# Patient Record
Sex: Male | Born: 1985 | Race: White | Hispanic: No | Marital: Single | Smoking: Current every day smoker
Health system: Southern US, Community
[De-identification: ages and names within clinical notes are randomized; demographics above are authoritative.]

## PROBLEM LIST (undated history)

## (undated) DIAGNOSIS — F988 Other specified behavioral and emotional disorders with onset usually occurring in childhood and adolescence: Secondary | ICD-10-CM

## (undated) DIAGNOSIS — F259 Schizoaffective disorder, unspecified: Secondary | ICD-10-CM

## (undated) HISTORY — PX: APPENDECTOMY: SHX54

---

## 2012-09-14 ENCOUNTER — Emergency Department (HOSPITAL_COMMUNITY)
Admission: EM | Admit: 2012-09-14 | Discharge: 2012-09-15 | Disposition: A | Discharge status: Home / Self Care | Payer: Self-pay | Attending: Emergency Medicine | Admitting: Emergency Medicine

## 2012-09-14 ENCOUNTER — Encounter (HOSPITAL_COMMUNITY): Payer: Self-pay | Admitting: Nurse Practitioner

## 2012-09-14 DIAGNOSIS — F1110 Opioid abuse, uncomplicated: Secondary | ICD-10-CM | POA: Insufficient documentation

## 2012-09-14 DIAGNOSIS — F17200 Nicotine dependence, unspecified, uncomplicated: Secondary | ICD-10-CM | POA: Insufficient documentation

## 2012-09-14 DIAGNOSIS — Z79899 Other long term (current) drug therapy: Secondary | ICD-10-CM | POA: Insufficient documentation

## 2012-09-14 DIAGNOSIS — R21 Rash and other nonspecific skin eruption: Secondary | ICD-10-CM | POA: Insufficient documentation

## 2012-09-14 LAB — ACETAMINOPHEN LEVEL: Acetaminophen (Tylenol), Serum: <15 ug/mL (ref 10–30)

## 2012-09-14 LAB — RAPID URINE DRUG SCREEN, HOSP PERFORMED: Barbiturates: NOT DETECTED

## 2012-09-14 LAB — COMPREHENSIVE METABOLIC PANEL
Alkaline Phosphatase: 78 U/L (ref 39–117)
BUN: 10 mg/dL (ref 6–23)
CO2: 30 mEq/L (ref 19–32)
Chloride: 96 mEq/L (ref 96–112)
GFR calc Af Amer: >90 mL/min (ref >90)
GFR calc non Af Amer: >90 mL/min (ref >90)
Glucose, Bld: 92 mg/dL (ref 70–99)
Potassium: 3.8 mEq/L (ref 3.5–5.1)
Total Bilirubin: 0.3 mg/dL (ref 0.3–1.2)

## 2012-09-14 LAB — CBC
HCT: 42.4 % (ref 39.0–52.0)
Hemoglobin: 14.6 g/dL (ref 13.0–17.0)
MCHC: 34.4 g/dL (ref 30.0–36.0)

## 2012-09-14 LAB — ETHANOL: Alcohol, Ethyl (B): <11 mg/dL (ref 0–11)

## 2012-09-14 MED ORDER — CLONIDINE HCL 0.1 MG PO TABS: 0.1000 mg | ORAL_TABLET | Freq: Every day | ORAL | Status: DC

## 2012-09-14 MED ORDER — DICYCLOMINE HCL 20 MG PO TABS
20.0000 mg | ORAL_TABLET | Freq: Four times a day (QID) | ORAL | Status: DC | PRN
  Filled 2012-09-14: qty 1

## 2012-09-14 MED ORDER — HYDROCORTISONE 1 % EX CREA
TOPICAL_CREAM | Freq: Two times a day (BID) | CUTANEOUS | Status: DC
  Administered 2012-09-14: 19:00:00 via TOPICAL
  Filled 2012-09-14 (×2): qty 28

## 2012-09-14 MED ORDER — HYDROXYZINE HCL 25 MG PO TABS: 25.0000 mg | ORAL_TABLET | Freq: Four times a day (QID) | ORAL | Status: DC | PRN

## 2012-09-14 MED ORDER — LOPERAMIDE HCL 2 MG PO CAPS: 2.0000 mg | ORAL_CAPSULE | ORAL | Status: DC | PRN

## 2012-09-14 MED ORDER — CLONIDINE HCL 0.1 MG PO TABS
0.1000 mg | ORAL_TABLET | Freq: Four times a day (QID) | ORAL | Status: DC
  Administered 2012-09-14 (×3): 0.1 mg via ORAL
  Filled 2012-09-14 (×3): qty 1

## 2012-09-14 MED ORDER — ARIPIPRAZOLE 15 MG PO TABS
15.0000 mg | ORAL_TABLET | Freq: Every day | ORAL | Status: DC
  Administered 2012-09-14: 15 mg via ORAL
  Filled 2012-09-14 (×2): qty 1

## 2012-09-14 MED ORDER — ONDANSETRON 4 MG PO TBDP: 4.0000 mg | ORAL_TABLET | Freq: Four times a day (QID) | ORAL | Status: DC | PRN

## 2012-09-14 MED ORDER — ZOLPIDEM TARTRATE 5 MG PO TABS: 5.0000 mg | ORAL_TABLET | Freq: Every evening | ORAL | Status: DC | PRN

## 2012-09-14 MED ORDER — NAPROXEN 250 MG PO TABS: 500.0000 mg | ORAL_TABLET | Freq: Two times a day (BID) | ORAL | Status: DC | PRN

## 2012-09-14 MED ORDER — METHOCARBAMOL 500 MG PO TABS: 500.0000 mg | ORAL_TABLET | Freq: Three times a day (TID) | ORAL | Status: DC | PRN

## 2012-09-14 MED ORDER — CLONIDINE HCL 0.1 MG PO TABS: 0.1000 mg | ORAL_TABLET | ORAL | Status: DC

## 2012-09-14 MED ORDER — TRAZODONE HCL 50 MG PO TABS
100.0000 mg | ORAL_TABLET | Freq: Every day | ORAL | Status: DC
  Administered 2012-09-14: 100 mg via ORAL
  Filled 2012-09-14: qty 2

## 2012-09-14 NOTE — ED Provider Notes (Signed)
History     CSN: [id number]  Arrival date & time 09/14/12  1436   First MD Initiated Contact with Patient 09/14/12 1451      Chief Complaint  Patient presents with  . Medical Clearance    (Consider location/radiation/quality/duration/timing/severity/associated sxs/prior treatment) The history is provided by the patient.   patient is requesting detox off of heroin. He's also in a methadone clinic using 45 mg a day. He states he continues to use heroin. He states he wants all the opiates now. He recently moved from [place] and is producing less heroin since he came here at the beginning of a new year. He was using just over bundle every other day. He also would use other opiates, however none with Tylenol in them. He does have a diagnosis of schizoaffective disorder. He states he's been off his Abilify and trazodone for a while. No suicidal homicidal thoughts. He does smoke. He injects mostly into his left antecubital area. No abscess. As far as he knows he does not have hepatitis or other blood-borne diseases.  History reviewed. No pertinent past medical history.  Past Surgical History  Procedure Date  . Appendectomy     History reviewed. No pertinent family history.  History  Substance Use Topics  . Smoking status: Current Every Day Smoker  . Smokeless tobacco: Not on file  . Alcohol Use: No      Review of Systems  Constitutional: Negative for activity change and appetite change.  HENT: Negative for neck stiffness.   Eyes: Negative for pain.  Respiratory: Negative for chest tightness and shortness of breath.   Cardiovascular: Negative for chest pain and leg swelling.  Gastrointestinal: Negative for nausea, vomiting, abdominal pain and diarrhea.  Genitourinary: Negative for flank pain.  Musculoskeletal: Negative for back pain.  Skin: Positive for rash.  Neurological: Negative for weakness, numbness and headaches.  Psychiatric/Behavioral: Negative for behavioral problems.     Allergies  Shellfish allergy  Home Medications   Current Outpatient Rx  Name  Route  Sig  Dispense  Refill  . ARIPIPRAZOLE 15 MG PO TABS   Oral   Take 15 mg by mouth at bedtime.         . TRAZODONE HCL 100 MG PO TABS   Oral   Take 100 mg by mouth at bedtime.           BP 99/50  Pulse 66  Temp 97.9 F (36.6 C) (Oral)  Resp 14  Ht 5\' 11"  (1.803 m)  Wt 145 lb (65.772 kg)  BMI 20.22 kg/m2  SpO2 99%  Physical Exam  Nursing note and vitals reviewed. Constitutional: He is oriented to person, place, and time. He appears well-developed and well-nourished.  HENT:  Head: Normocephalic and atraumatic.  Eyes: EOM are normal. Pupils are equal, round, and reactive to light.  Neck: Normal range of motion. Neck supple.  Cardiovascular: Normal rate, regular rhythm and normal heart sounds.   No murmur heard. Pulmonary/Chest: Effort normal and breath sounds normal.  Abdominal: Soft. Bowel sounds are normal. He exhibits no distension and no mass. There is no tenderness. There is no rebound and no guarding.  Musculoskeletal: Normal range of motion. He exhibits no edema.  Neurological: He is alert and oriented to person, place, and time. No cranial nerve deficit.  Skin: Skin is warm and dry.       Injection site a left antecubital area. No abscess. Erythematous rash to right lower extremity near knee. Not raised.  Psychiatric: He  has a normal mood and affect.    ED Course  Procedures (including critical care time)  Labs Reviewed  COMPREHENSIVE METABOLIC PANEL - Abnormal; Notable for the following:    Sodium 134 (*)     All other components within normal limits  SALICYLATE LEVEL - Abnormal; Notable for the following:    Salicylate Lvl <2.0 (*)     All other components within normal limits  URINE RAPID DRUG SCREEN (HOSP PERFORMED) - Abnormal; Notable for the following:    Opiates POSITIVE (*)     All other components within normal limits  ACETAMINOPHEN LEVEL  CBC  ETHANOL    No results found.   1. Heroin abuse       MDM  Patient requesting detox off of heroin and methadone. Off of his psych medications, but states she's not having any symptoms of that. Likely medically cleared, however labs are pending. Patient will be seen by the ACT team.        Juliet Rude. Rubin Payor, MD 09/14/12 [phone]
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
## 2012-09-14 NOTE — ED Notes (Signed)
Pt moved to Pod C and settled into room; warm blankets given; Drinda Butts, RN aware pt is in room

## 2012-09-14 NOTE — BHH Counselor (Signed)
Spoke with [facility] Delice Bison) pt will be reviewed for admission;  Pt states that he does not live in [place] but was living at a homeless shelter in [place]; Pt states he would like to be moved to [facility] after completing Detox

## 2012-09-14 NOTE — ED Notes (Signed)
ACT Team at bedside. Pt calm and cooperative.

## 2012-09-14 NOTE — ED Notes (Signed)
MD at bedside. 

## 2012-09-14 NOTE — ED Notes (Signed)
Pt reports [facility] [place] [facility] and has also been abusing heroine at the same time. Did receive methadone this am. Pt states he wants to stop using heroine and also wants to get off the methadone. Pt states he is "just tired of using substances to feel normal." pt denies thoughts of harming self or others. Reports occasional marijuana use.

## 2012-09-15 NOTE — ED Notes (Signed)
Called parents- states wants to leave, "Im voluntary, so I [facility] to [facility]-- and my mom will pick me up there. I am not going to get high-- I only have $4. " pacing around room, yawning,

## 2012-09-15 NOTE — ED Notes (Signed)
Belongings given to patient, getting dressed. Jittery- pacing- discussed treatment, still wants to leave AMA>

## 2012-09-15 NOTE — ED Notes (Signed)
Coming off of Methadone 45mg /day [facility]. Pacing around room after eating breakfast

## 2012-09-15 NOTE — BH Assessment (Signed)
Assessment Note  Update:  Pt declined [facility] due to taking 45 mg methadone and was pending [facility].  However, pt requesting to leave, stating he is voluntary and wants his mother to take him back to [facility].  Pt stated he only has $4 and is not going to use.  Pt refused treatment offered (inpatient treatment) and pt to be discharged from [facility] AMA.  ED nurse notificed EDP [place].  Writer will call [facility] to inform them.     Disposition:  Disposition Disposition of Patient: Treatment offered and refused Type of treatment offered and refused: In-patient Patient referred to: Other (Comment) (Pt left AMA)  On Site Evaluation by:   Reviewed with Physician:  Caryl Asp, Rennis Harding 09/15/2012 9:12 AM

## 2012-09-15 NOTE — ED Notes (Signed)
Pt wants to sign out AMA- Dr. Ranae Palms informed. Pt on phone with mother.

## 2012-09-25 ENCOUNTER — Emergency Department (HOSPITAL_COMMUNITY)
Admission: EM | Admit: 2012-09-25 | Discharge: 2012-09-25 | Disposition: A | Discharge status: Home / Self Care | Payer: Self-pay | Attending: Emergency Medicine | Admitting: Emergency Medicine

## 2012-09-25 ENCOUNTER — Encounter (HOSPITAL_COMMUNITY): Payer: Self-pay | Admitting: Emergency Medicine

## 2012-09-25 DIAGNOSIS — F410 Panic disorder [episodic paroxysmal anxiety] without agoraphobia: Secondary | ICD-10-CM | POA: Insufficient documentation

## 2012-09-25 DIAGNOSIS — G479 Sleep disorder, unspecified: Secondary | ICD-10-CM | POA: Insufficient documentation

## 2012-09-25 DIAGNOSIS — F1110 Opioid abuse, uncomplicated: Secondary | ICD-10-CM | POA: Insufficient documentation

## 2012-09-25 DIAGNOSIS — F17200 Nicotine dependence, unspecified, uncomplicated: Secondary | ICD-10-CM | POA: Insufficient documentation

## 2012-09-25 DIAGNOSIS — F411 Generalized anxiety disorder: Secondary | ICD-10-CM | POA: Insufficient documentation

## 2012-09-25 DIAGNOSIS — Z760 Encounter for issue of repeat prescription: Secondary | ICD-10-CM | POA: Insufficient documentation

## 2012-09-25 DIAGNOSIS — F259 Schizoaffective disorder, unspecified: Secondary | ICD-10-CM | POA: Insufficient documentation

## 2012-09-25 DIAGNOSIS — F419 Anxiety disorder, unspecified: Secondary | ICD-10-CM

## 2012-09-25 DIAGNOSIS — Z79899 Other long term (current) drug therapy: Secondary | ICD-10-CM | POA: Insufficient documentation

## 2012-09-25 HISTORY — DX: Schizoaffective disorder, unspecified: F25.9

## 2012-09-25 MED ORDER — CLONAZEPAM 1 MG PO TABS: 1.0000 mg | ORAL_TABLET | Freq: Three times a day (TID) | ORAL | Status: DC | PRN

## 2012-09-25 MED ORDER — ARIPIPRAZOLE 15 MG PO TABS: 15.0000 mg | ORAL_TABLET | Freq: Every day | ORAL | Status: DC

## 2012-09-25 MED ORDER — TRAZODONE HCL 100 MG PO TABS: 100.0000 mg | ORAL_TABLET | Freq: Every day | ORAL | Status: DC

## 2012-09-25 NOTE — Progress Notes (Signed)
[facility] ED CM consulted by ACT member [place] about pt possible need of medication assistance Pt presently not at [facility] but gave permission for CM to contact him at number listed (his father's number)  CM left a voice message at [phone] Pending a return call from pt

## 2012-09-25 NOTE — BH Assessment (Signed)
BHH Assessment Progress Note   Received a call from the PA-Heather. Requested that ACT provides patient with a resource list to local therapist and psychiatrist in the area. Sts that patient is new to this area and needs a provider.   Writer met with patient briefly and provided him with a resource list. Patient was specifically encouraged to follow up with [facility] (mental health). Patient stating that he has already been to mental health, however; the psychiatrist refused to provide him with the medications he was requesting (Abilify, Clonopin, and Trazadone).   Patient inquired about other local psychiatrist specifically those that provided a sliding scale fee. Writer pointed out the places on the list given such as [employer] of the [place], [facility], and [facility].   Patient was then informed that case management may be able to assist him with his needs, however; their is no one available until 10 am. Patient will call the ED later today to try and speak with case management regarding medication management referrals, programs, etc.

## 2012-09-25 NOTE — ED Notes (Signed)
Pt stated that he is out of his meds for 3wks (Pysch med) see med list.He unable to function

## 2012-09-25 NOTE — ED Provider Notes (Signed)
Medical screening examination/treatment/procedure(s) were performed by non-physician practitioner and as supervising physician I was immediately available for consultation/collaboration.  Doug Sou, MD 09/25/12 1555

## 2012-09-25 NOTE — ED Provider Notes (Signed)
History     CSN: [id number]  Arrival date & time 09/25/12  0714   First MD Initiated Contact with Patient 09/25/12 0745      Chief Complaint  Patient presents with  . Anxiety    (Consider location/radiation/quality/duration/timing/severity/associated sxs/prior treatment) HPI Comments: Patient with a history of Schizoaffective disorder presents today requesting refill of his psychiatric medications.  He reports that he ran out of the medications three weeks ago.  He is currently on Trazodone, Abilify, and Klonopin.  He moved to [place] a month ago from [place] and has not yet established care with a Therapist, sports in [place].  He was seen at [facility] in [place].  Patient has a history of Heroine abuse, but reports that he has not used in the past 11 days.  He denies HI or SI.  He reports that he has been feeling more anxious recently since being off of the medications.  He denies any other symptoms at this time.  The history is provided by the patient.    Past Medical History  Diagnosis Date  . Schizoaffective disorder     Past Surgical History  Procedure Date  . Appendectomy     No family history on file.  History  Substance Use Topics  . Smoking status: Current Every Day Smoker  . Smokeless tobacco: Not on file  . Alcohol Use: No      Review of Systems  Psychiatric/Behavioral: Positive for sleep disturbance. Negative for suicidal ideas, hallucinations, confusion and self-injury. The patient is nervous/anxious.   All other systems reviewed and are negative.    Allergies  Shellfish allergy  Home Medications   Current Outpatient Rx  Name  Route  Sig  Dispense  Refill  . ARIPIPRAZOLE 15 MG PO TABS   Oral   Take 15 mg by mouth at bedtime.         Marland Kitchen CLONAZEPAM 1 MG PO TABS   Oral   Take 3 mg by mouth 3 (three) times daily as needed.         . TRAZODONE HCL 100 MG PO TABS   Oral   Take 100 mg by mouth at bedtime.           BP 116/77   Pulse 93  Temp 98 F (36.7 C) (Oral)  Resp 20  Ht 5\' 11"  (1.803 m)  Wt 145 lb (65.772 kg)  BMI 20.22 kg/m2  SpO2 98%  Physical Exam  Nursing note and vitals reviewed. Constitutional: He appears well-developed and well-nourished. No distress.  HENT:  Head: Normocephalic and atraumatic.  Eyes: EOM are normal. Pupils are equal, round, and reactive to light.  Neck: Normal range of motion. Neck supple.  Cardiovascular: Normal rate, regular rhythm and normal heart sounds.   Pulmonary/Chest: Effort normal and breath sounds normal.  Musculoskeletal: Normal range of motion.  Neurological: He is alert.  Skin: Skin is warm and dry. He is not diaphoretic.  Psychiatric: Thought content normal. His mood appears anxious. He is not agitated, not aggressive and not actively hallucinating. Cognition and memory are normal. He expresses no homicidal and no suicidal ideation. He expresses no suicidal plans and no homicidal plans.    ED Course  Procedures (including critical care time)  Labs Reviewed - No data to display No results found.   No diagnosis found.    MDM  Patient presenting requesting refills of his psychiatric medications.  Patient given refill for one weeks worth.  Patient denies SI or HI.  Patient  given resources by ACT team so that he can establish follow up care.  Return precautions given to the patient.        Pascal Lux [place], PA-C 09/25/12 [phone]

## 2012-09-26 NOTE — Progress Notes (Signed)
[facility] ED CM checked on 09/25/12 & 09/26/12 and there has not been any return calls from pt nor his father CM If not return call by September 29 2012 CM will sign off

## 2012-09-29 ENCOUNTER — Emergency Department (HOSPITAL_COMMUNITY)
Admission: EM | Admit: 2012-09-29 | Discharge: 2012-09-29 | Disposition: A | Discharge status: Home / Self Care | Payer: Self-pay | Attending: Emergency Medicine | Admitting: Emergency Medicine

## 2012-09-29 ENCOUNTER — Encounter (HOSPITAL_COMMUNITY): Payer: Self-pay | Admitting: Emergency Medicine

## 2012-09-29 DIAGNOSIS — Z760 Encounter for issue of repeat prescription: Secondary | ICD-10-CM | POA: Insufficient documentation

## 2012-09-29 DIAGNOSIS — F209 Schizophrenia, unspecified: Secondary | ICD-10-CM | POA: Insufficient documentation

## 2012-09-29 DIAGNOSIS — F17200 Nicotine dependence, unspecified, uncomplicated: Secondary | ICD-10-CM | POA: Insufficient documentation

## 2012-09-29 MED ORDER — CLONAZEPAM 1 MG PO TABS: 1.0000 mg | ORAL_TABLET | Freq: Three times a day (TID) | ORAL | Status: AC | PRN

## 2012-09-29 MED ORDER — ARIPIPRAZOLE 15 MG PO TABS: 15.0000 mg | ORAL_TABLET | Freq: Every day | ORAL | Status: DC

## 2012-09-29 MED ORDER — TRAZODONE HCL 100 MG PO TABS: 100.0000 mg | ORAL_TABLET | Freq: Every day | ORAL | Status: DC

## 2012-09-29 NOTE — ED Notes (Signed)
Pt stated that he has been out of his medications for one month

## 2012-09-29 NOTE — Progress Notes (Signed)
Pt returned to [facility] ED. CM had not received a return call from pt He reports that he needs a self pay psychiatrist Reports he called the ones provided to him on 09/25/12 and he can not afford $250 CM left a voice message for ACT member at [phone] who responded to offer [facility] at [phone] and [facility] which were also offered on 09/25/12 to pt prior to d/c by [facility]. CM discussed case with EDP PA, Tiffany List of resources provided to offer pt for possible self pay [facility] providers. CM has only Medical providers Pt states [facility] is "a joke" Pt encouraged to get to one of the self pay [facility] providers. Cm consulted with [facility] for community care liaison who will consult with [facility] community care providers.

## 2012-09-29 NOTE — Progress Notes (Signed)
To get 09/25/12 medications filled pt confirms he used [facility] outpatient pharmacy on [address] for the $9 cost

## 2012-09-29 NOTE — ED Provider Notes (Signed)
History   This chart was scribed for [name] PA-C, a non-physician practitioner working with No att. providers found by Lewanda Rife, ED Scribe. This patient was seen in room WTR5/WTR5 and the patient's care was started at 5:07 pm.     CSN: [id number]  Arrival date & time 09/29/12  1605   First MD Initiated Contact with Patient 09/29/12 1639      Chief Complaint  Patient presents with  . Medication Refill    (Consider location/radiation/quality/duration/timing/severity/associated sxs/prior treatment) HPI Matthew Hays is a 27 y.o. male who presents to the Emergency Department complaining of medication refill. Pt reports he ran out of his medications days ago that treat his schizophrenia. Pt states he lives in a shelter and can not afford his medications at this time. Pt reports intermittently worsening anxiety, mood swings, and auditory hallucinations onset 3 days. Pt denies suicidal ideations and homicidal ideations.    Past Medical History  Diagnosis Date  . Schizoaffective disorder     Past Surgical History  Procedure Date  . Appendectomy     No family history on file.  History  Substance Use Topics  . Smoking status: Current Every Day Smoker  . Smokeless tobacco: Not on file  . Alcohol Use: No      Review of Systems  Constitutional: Negative.   HENT: Negative.   Respiratory: Negative.   Cardiovascular: Negative.   Gastrointestinal: Negative.   Musculoskeletal: Negative.   Skin: Negative.   Neurological: Negative.   Hematological: Negative.   Psychiatric/Behavioral: Positive for hallucinations (auditory). Negative for suicidal ideas. The patient is nervous/anxious.   All other systems reviewed and are negative.   A complete 10 system review of systems was obtained and all systems are negative except as noted in the HPI and PMH.   Allergies  Shellfish allergy  Home Medications   Current Outpatient Rx  Name  Route  Sig  Dispense  Refill  .  ARIPIPRAZOLE 15 MG PO TABS   Oral   Take 1 tablet (15 mg total) by mouth daily.   34 tablet   0     Enough for 34 days   . CLONAZEPAM 1 MG PO TABS   Oral   Take 1 tablet (1 mg total) by mouth 3 (three) times daily as needed for anxiety.   102 tablet   0     Enough for 34 days   . TRAZODONE HCL 100 MG PO TABS   Oral   Take 1 tablet (100 mg total) by mouth at bedtime.   34 tablet   0     Enough for 34 days     BP 113/60  Pulse 89  Temp 98.4 F (36.9 C) (Oral)  Resp 18  SpO2 97%  Physical Exam  Nursing note and vitals reviewed. Constitutional: He is oriented to person, place, and time. He appears well-developed and well-nourished. No distress.  HENT:  Head: Normocephalic and atraumatic.  Eyes: Conjunctivae normal and EOM are normal.  Neck: Neck supple. No tracheal deviation present.  Cardiovascular: Normal rate.   Pulmonary/Chest: Effort normal. No respiratory distress.  Abdominal: Soft.  Musculoskeletal: Normal range of motion.  Neurological: He is alert and oriented to person, place, and time.  Skin: Skin is warm and dry.  Psychiatric: His mood appears anxious. He is actively hallucinating (auditory). He expresses no homicidal and no suicidal ideation.    ED Course  Procedures (including critical care time)  Medications  ARIPiprazole (ABILIFY) 15 MG tablet (not  administered)  clonazePAM (KLONOPIN) 1 MG tablet (not administered)  traZODone (DESYREL) 100 MG tablet (not administered)    Labs Reviewed - No data to display No results found.   1. Medication refill   2. Schizophrenia       MDM  Social work assisted in getting pt f/u and providing medications  Pt has been advised of the symptoms that warrant their return to the ED. Patient has voiced understanding and has agreed to follow-up with the PCP or specialist.    I personally performed the services described in this documentation, which was scribed in my presence. The recorded information has  been reviewed and is accurate.   Dorthula Matas, PA 09/30/12 0040

## 2012-09-29 NOTE — Progress Notes (Signed)
CSW met with pt at bedside to discuss patient concerns. Pt stated that he did not have a good experience at [facility] and was needing to see another psychiatrist that he could afford. Pt stated that he is living in a shelter as he recently moved from [place] however is working. Pt states that due to financial strains he has not been able to receive his medications. Pt is working with ED nurse case manager regarding medication assistance program. CSW also provided him with information for [facility] of the [place] and discussed the initial assessment fee and sliding scale visits after. CSW also discussed with patient that Family services may be able to help with medications. CSW and pt also discussed the [facility] and the congregational nurse program. Pt plans to follow up with these outpatient resources. Pt thanked csw for concern and support and assistance.   Catha Gosselin, LCSWA  [phone] .09/29/2012 1634pm

## 2012-09-29 NOTE — Progress Notes (Signed)
MATCH information entered in PDMI and MATCH letter completed Partnership for community liaison offered also [facility] (sliding scale- [phone]) All resource information provided to pt Discussed with pt re applying for medicaid services in [employer] .  Pt reports being from [facility] where he "only had to pay for clonazepam" Pt requesting another 10 day supply of clonazepam and abilify This was discussed with ED PA . CM discussed and provided written information for self pay pcps, importance of pcp for f/u care, www.needymeds.org, discounted pharmacies, and other [place] resources such as financial assistance, DSS and  health department Reviewed Health connect number to assist with finding self pay provider close to pt's residence. Reviewed resources for [facility], general medical clinics, [facility] out patient pharmacies, housing, and other resources in [employer]. Pt voiced understanding and appreciation of resources provided

## 2012-10-01 NOTE — ED Provider Notes (Signed)
Medical screening examination/treatment/procedure(s) were performed by non-physician practitioner and as supervising physician I was immediately available for consultation/collaboration.  Francyne Arreaga R. Temperence Zenor, MD 10/01/12 1454 

## 2012-10-27 ENCOUNTER — Encounter (HOSPITAL_COMMUNITY): Payer: Self-pay | Admitting: Emergency Medicine

## 2012-10-27 ENCOUNTER — Emergency Department (HOSPITAL_COMMUNITY)
Admission: EM | Admit: 2012-10-27 | Discharge: 2012-10-28 | Disposition: A | Discharge status: Home / Self Care | Payer: Self-pay | Attending: Emergency Medicine | Admitting: Emergency Medicine

## 2012-10-27 ENCOUNTER — Emergency Department (HOSPITAL_COMMUNITY): Payer: Self-pay

## 2012-10-27 DIAGNOSIS — R109 Unspecified abdominal pain: Secondary | ICD-10-CM | POA: Insufficient documentation

## 2012-10-27 DIAGNOSIS — F17200 Nicotine dependence, unspecified, uncomplicated: Secondary | ICD-10-CM | POA: Insufficient documentation

## 2012-10-27 DIAGNOSIS — K625 Hemorrhage of anus and rectum: Secondary | ICD-10-CM | POA: Insufficient documentation

## 2012-10-27 DIAGNOSIS — F259 Schizoaffective disorder, unspecified: Secondary | ICD-10-CM | POA: Insufficient documentation

## 2012-10-27 DIAGNOSIS — R443 Hallucinations, unspecified: Secondary | ICD-10-CM | POA: Insufficient documentation

## 2012-10-27 DIAGNOSIS — F411 Generalized anxiety disorder: Secondary | ICD-10-CM | POA: Insufficient documentation

## 2012-10-27 DIAGNOSIS — F1210 Cannabis abuse, uncomplicated: Secondary | ICD-10-CM | POA: Insufficient documentation

## 2012-10-27 DIAGNOSIS — Z91199 Patient's noncompliance with other medical treatment and regimen due to unspecified reason: Secondary | ICD-10-CM | POA: Insufficient documentation

## 2012-10-27 DIAGNOSIS — Z79899 Other long term (current) drug therapy: Secondary | ICD-10-CM | POA: Insufficient documentation

## 2012-10-27 DIAGNOSIS — R1110 Vomiting, unspecified: Secondary | ICD-10-CM | POA: Insufficient documentation

## 2012-10-27 HISTORY — DX: Other specified behavioral and emotional disorders with onset usually occurring in childhood and adolescence: F98.8

## 2012-10-27 LAB — CBC WITH DIFFERENTIAL/PLATELET
Basophils Relative: 0 % (ref 0–1)
Eosinophils Absolute: 0.1 10*3/uL (ref 0.0–0.7)
Hemoglobin: 15.6 g/dL (ref 13.0–17.0)
Lymphs Abs: 2 10*3/uL (ref 0.7–4.0)
MCH: 27.8 pg (ref 26.0–34.0)
MCHC: 33.1 g/dL (ref 30.0–36.0)
Monocytes Relative: 7 % (ref 3–12)
Neutro Abs: 13.9 10*3/uL — ABNORMAL HIGH (ref 1.7–7.7)
Neutrophils Relative %: 80 % — ABNORMAL HIGH (ref 43–77)
Platelets: 266 10*3/uL (ref 150–400)
RBC: 5.61 MIL/uL (ref 4.22–5.81)

## 2012-10-27 LAB — COMPREHENSIVE METABOLIC PANEL
ALT: 16 U/L (ref 0–53)
Albumin: 4 g/dL (ref 3.5–5.2)
Alkaline Phosphatase: 70 U/L (ref 39–117)
BUN: 7 mg/dL (ref 6–23)
Chloride: 102 mEq/L (ref 96–112)
Potassium: 3.9 mEq/L (ref 3.5–5.1)
Sodium: 139 mEq/L (ref 135–145)
Total Bilirubin: 0.7 mg/dL (ref 0.3–1.2)
Total Protein: 7.7 g/dL (ref 6.0–8.3)

## 2012-10-27 LAB — URINALYSIS, ROUTINE W REFLEX MICROSCOPIC
Ketones, ur: NEGATIVE mg/dL
Leukocytes, UA: NEGATIVE
Nitrite: NEGATIVE
Protein, ur: NEGATIVE mg/dL
Urobilinogen, UA: 1 mg/dL (ref 0.0–1.0)

## 2012-10-27 LAB — RAPID URINE DRUG SCREEN, HOSP PERFORMED
Amphetamines: NOT DETECTED
Barbiturates: NOT DETECTED
Benzodiazepines: NOT DETECTED
Tetrahydrocannabinol: POSITIVE — AB

## 2012-10-27 MED ORDER — TRAZODONE HCL 100 MG PO TABS: 100.0000 mg | ORAL_TABLET | Freq: Every day | ORAL | Status: DC

## 2012-10-27 MED ORDER — ONDANSETRON HCL 4 MG PO TABS: 4.0000 mg | ORAL_TABLET | Freq: Three times a day (TID) | ORAL | Status: DC | PRN

## 2012-10-27 MED ORDER — ARIPIPRAZOLE 15 MG PO TABS
15.0000 mg | ORAL_TABLET | Freq: Every day | ORAL | Status: DC
  Administered 2012-10-27: 15 mg via ORAL
  Filled 2012-10-27: qty 1

## 2012-10-27 MED ORDER — LORAZEPAM 1 MG PO TABS
1.0000 mg | ORAL_TABLET | Freq: Three times a day (TID) | ORAL | Status: DC | PRN
  Administered 2012-10-27: 1 mg via ORAL
  Filled 2012-10-27: qty 1

## 2012-10-27 MED ORDER — ONDANSETRON 8 MG PO TBDP
8.0000 mg | ORAL_TABLET | Freq: Once | ORAL | Status: AC
  Administered 2012-10-27: 8 mg via ORAL
  Filled 2012-10-27: qty 1

## 2012-10-27 MED ORDER — ARIPIPRAZOLE 15 MG PO TABS: 15.0000 mg | ORAL_TABLET | Freq: Every day | ORAL | Status: DC

## 2012-10-27 MED ORDER — NICOTINE 21 MG/24HR TD PT24
21.0000 mg | MEDICATED_PATCH | Freq: Every day | TRANSDERMAL | Status: DC
  Administered 2012-10-27: 21 mg via TRANSDERMAL
  Filled 2012-10-27: qty 1

## 2012-10-27 MED ORDER — ACETAMINOPHEN 325 MG PO TABS: 650.0000 mg | ORAL_TABLET | ORAL | Status: DC | PRN

## 2012-10-27 MED ORDER — ZOLPIDEM TARTRATE 5 MG PO TABS: 5.0000 mg | ORAL_TABLET | Freq: Every evening | ORAL | Status: DC | PRN

## 2012-10-27 MED ORDER — ALUM & MAG HYDROXIDE-SIMETH 200-200-20 MG/5ML PO SUSP
30.0000 mL | ORAL | Status: DC | PRN
  Administered 2012-10-27: 30 mL via ORAL
  Filled 2012-10-27: qty 30

## 2012-10-27 MED ORDER — CLONAZEPAM 0.5 MG PO TABS: 1.0000 mg | ORAL_TABLET | Freq: Three times a day (TID) | ORAL | Status: DC | PRN

## 2012-10-27 NOTE — ED Notes (Signed)
Patient sitting on the floor talking on phone.

## 2012-10-27 NOTE — ED Provider Notes (Signed)
History    This chart was scribed for non-physician practitioner working with Lyanne Co, MD by Charolett Bumpers, ED Scribe. This patient was seen in room WTR3/WLPT3 and the patient's care was started at 1644.   CSN: [id number]  Arrival date & time 10/27/12  1641   First MD Initiated Contact with Patient 10/27/12 1644      Chief Complaint  Patient presents with  . Medical Clearance    The history is provided by the patient. No language interpreter was used.   Matthew Hays is a 27 y.o. male who has a h/o schizoaffective disorder and ADHD presents to the Emergency Department for medical clearance. He states that he needs to be hospitalized to get his medications. He recently moved here from [facility] and has been unable to find a doctor to prescribe his medications. He reports that he has auditory hallucinations, anxiety and is unable to function normally due to this. He denies any SI or HI. He also reports rectal bleeding that started 4 days ago. He states that his stools are mixed with bright red blood. He states his stools have been light in color and denies any melena. He reports associated abdominal pain, vomiting and decreased appetite. He reports 7 BM's daily when 1-2 BM is normal for him. He reports he normally takes Vyvance, Abilify, Trazodone and Klonopin. He states that he has not been on Vyvance for the past month. He denies any fevers. He is a smoker. He admits to marijuana use but denies any alcohol or drug abuse.   Past Medical History  Diagnosis Date  . Schizoaffective disorder     Past Surgical History  Procedure Laterality Date  . Appendectomy      No family history on file.  History  Substance Use Topics  . Smoking status: Current Every Day Smoker  . Smokeless tobacco: Not on file  . Alcohol Use: No      Review of Systems A complete 10 system review of systems was obtained and all systems are negative except as noted in the HPI and PMH.   Allergies   Shellfish allergy  Home Medications   Current Outpatient Rx  Name  Route  Sig  Dispense  Refill  . ARIPiprazole (ABILIFY) 15 MG tablet   Oral   Take 1 tablet (15 mg total) by mouth daily.   34 tablet   0     Enough for 34 days   . clonazePAM (KLONOPIN) 1 MG tablet   Oral   Take 1 tablet (1 mg total) by mouth 3 (three) times daily as needed for anxiety.   102 tablet   0     Enough for 34 days   . traZODone (DESYREL) 100 MG tablet   Oral   Take 1 tablet (100 mg total) by mouth at bedtime.   34 tablet   0     Enough for 34 days     BP 129/77  Pulse 100  Temp(Src) 98.4 F (36.9 C) (Oral)  Resp 19  SpO2 100%  Physical Exam  Nursing note and vitals reviewed. Constitutional: He is oriented to person, place, and time. He appears well-developed and well-nourished. No distress.  HENT:  Head: Normocephalic and atraumatic.  Eyes: Conjunctivae and EOM are normal.  Neck: Normal range of motion. Neck supple. No tracheal deviation present.  Cardiovascular: Normal rate, regular rhythm and normal heart sounds.   Pulmonary/Chest: Effort normal and breath sounds normal. No respiratory distress. He has no wheezes.  Abdominal: Soft. He exhibits no distension. There is no tenderness.  Musculoskeletal: Normal range of motion. He exhibits no edema.  Neurological: He is alert and oriented to person, place, and time.  Skin: Skin is warm and dry.  Psychiatric: He has a normal mood and affect. His behavior is normal.    ED Course  Procedures (including critical care time)  DIAGNOSTIC STUDIES: Oxygen Saturation is 100% on room air, normal by my interpretation.    COORDINATION OF CARE:  17:00-Discussed planned course of treatment with the patient including medical clearance prior to behavioral health evaluation, who is agreeable at this time.    Labs Reviewed  CBC WITH DIFFERENTIAL - Abnormal; Notable for the following:    WBC 17.4 (*)    Neutrophils Relative 80 (*)    Neutro  Abs 13.9 (*)    Monocytes Absolute 1.3 (*)    All other components within normal limits  URINE RAPID DRUG SCREEN (HOSP PERFORMED) - Abnormal; Notable for the following:    Cocaine POSITIVE (*)    Tetrahydrocannabinol POSITIVE (*)    All other components within normal limits  COMPREHENSIVE METABOLIC PANEL  ETHANOL  URINALYSIS, ROUTINE W REFLEX MICROSCOPIC   Dg Chest 2 View  10/27/2012  *RADIOLOGY REPORT*  Clinical Data: Medical clearance  CHEST - 2 VIEW  Comparison: None.  Findings: Normal mediastinum and cardiac silhouette.  Normal pulmonary  vasculature.  No evidence of effusion, infiltrate, or pneumothorax.  No acute bony abnormality.  IMPRESSION: Normal chest radiograph.   Original Report Authenticated By: Genevive Bi, M.D.      No diagnosis found.  1. Auditory hallucinations 2. H/o schizo-affective disorder, unmedicated 3. Substance abuse   MDM  Patient is eating and drinking - no vomiting or complaint of nausea here. He appears stable, oriented. No evidence of infection (neg chest x-ray, urine, unremarkable exam) to explain leukocytosis - possibly cocaine related. Patient medically cleared and will need assessment by BHS to determine need for placement   I personally performed the services described in this documentation, which was scribed in my presence. The recorded information has been reviewed and is accurate.        Arnoldo Hooker, PA-C 10/27/12 1914

## 2012-10-27 NOTE — ED Notes (Signed)
States that he moved here from [place]. States that he has not been able to find a doctor to get his schizophrenic medications and ADD meds. States that he is having some hallucinations. States that he feels as though he has the flu. States that he has blood in his stool.

## 2012-10-27 NOTE — ED Provider Notes (Addendum)
Medical screening examination/treatment/procedure(s) were conducted as a shared visit with non-physician practitioner(s) and myself.  I personally evaluated the patient during the encounter  The patient was seen and evaluated by the psychiatrist recommends discharge home with outpatient community health resources.  The patient was prescribed his trazodone his Abilify and a very short course of his Klonopin.  I will his providers to prescribe him his Vyvance.   no significant complaints at this time.   Lyanne Co, MD 10/27/12 2049  Lyanne Co, MD 10/27/12 [phone]

## 2012-10-28 MED ORDER — CLONAZEPAM 0.5 MG PO TABS: 1.0000 mg | ORAL_TABLET | Freq: Three times a day (TID) | ORAL | Status: DC | PRN

## 2012-10-28 MED ORDER — ARIPIPRAZOLE 15 MG PO TABS: 15.0000 mg | ORAL_TABLET | Freq: Every day | ORAL | Status: AC

## 2012-10-28 MED ORDER — TRAZODONE HCL 100 MG PO TABS: 100.0000 mg | ORAL_TABLET | Freq: Every day | ORAL | Status: AC

## 2012-10-28 MED ORDER — TRAZODONE HCL 100 MG PO TABS: 100.0000 mg | ORAL_TABLET | Freq: Every day | ORAL | Status: DC

## 2012-10-28 MED ORDER — ARIPIPRAZOLE 15 MG PO TABS: 15.0000 mg | ORAL_TABLET | Freq: Every day | ORAL | Status: DC

## 2012-10-29 ENCOUNTER — Encounter (HOSPITAL_COMMUNITY): Payer: Self-pay | Admitting: *Deleted

## 2012-10-29 ENCOUNTER — Encounter (HOSPITAL_COMMUNITY): Payer: Self-pay | Admitting: Emergency Medicine

## 2012-10-29 ENCOUNTER — Emergency Department (HOSPITAL_COMMUNITY)
Admission: EM | Admit: 2012-10-29 | Discharge: 2012-10-29 | Disposition: A | Discharge status: Home / Self Care | Payer: Self-pay | Attending: Emergency Medicine | Admitting: Emergency Medicine

## 2012-10-29 DIAGNOSIS — F259 Schizoaffective disorder, unspecified: Secondary | ICD-10-CM | POA: Insufficient documentation

## 2012-10-29 DIAGNOSIS — Z5900 Homelessness unspecified: Secondary | ICD-10-CM | POA: Insufficient documentation

## 2012-10-29 DIAGNOSIS — F17200 Nicotine dependence, unspecified, uncomplicated: Secondary | ICD-10-CM | POA: Insufficient documentation

## 2012-10-29 DIAGNOSIS — F1410 Cocaine abuse, uncomplicated: Secondary | ICD-10-CM | POA: Insufficient documentation

## 2012-10-29 DIAGNOSIS — Z79899 Other long term (current) drug therapy: Secondary | ICD-10-CM | POA: Insufficient documentation

## 2012-10-29 DIAGNOSIS — F3289 Other specified depressive episodes: Secondary | ICD-10-CM | POA: Insufficient documentation

## 2012-10-29 DIAGNOSIS — F411 Generalized anxiety disorder: Secondary | ICD-10-CM | POA: Insufficient documentation

## 2012-10-29 DIAGNOSIS — F1910 Other psychoactive substance abuse, uncomplicated: Secondary | ICD-10-CM | POA: Insufficient documentation

## 2012-10-29 DIAGNOSIS — Z8659 Personal history of other mental and behavioral disorders: Secondary | ICD-10-CM | POA: Insufficient documentation

## 2012-10-29 DIAGNOSIS — F988 Other specified behavioral and emotional disorders with onset usually occurring in childhood and adolescence: Secondary | ICD-10-CM | POA: Insufficient documentation

## 2012-10-29 MED ORDER — HYDROXYZINE HCL 25 MG PO TABS: 50.0000 mg | ORAL_TABLET | Freq: Once | ORAL | Status: DC

## 2012-10-29 MED ORDER — CLONAZEPAM 0.5 MG PO TABS
1.0000 mg | ORAL_TABLET | Freq: Once | ORAL | Status: AC
  Administered 2012-10-29: 1 mg via ORAL
  Filled 2012-10-29: qty 2

## 2012-10-29 NOTE — ED Provider Notes (Signed)
History     CSN: [id number]  Arrival date & time 10/29/12  0039   First MD Initiated Contact with Patient 10/29/12 0130      Chief Complaint  Patient presents with  . Suicidal    (Consider location/radiation/quality/duration/timing/severity/associated sxs/prior treatment) HPI HX per PT.  Was kicked out of hotel today. Is now homeless.  He spent 200 on cocaine and is now depressed and homeless.  He is hoping he can be be admitted somewhere for depression. He was evaluated by PSY yesterday for the same with offered admit at [facility].  He declined at that time.  He was given Rx Klonopin, Abilify and trazadone. No SI/ HI. No hallucinations. Is requested something for his nerves. Symptoms mod in severity Past Medical History  Diagnosis Date  . Schizoaffective disorder   . ADD (attention deficit disorder)     Past Surgical History  Procedure Laterality Date  . Appendectomy      No family history on file.  History  Substance Use Topics  . Smoking status: Current Every Day Smoker  . Smokeless tobacco: Not on file  . Alcohol Use: No      Review of Systems  Constitutional: Negative for fever and chills.  HENT: Negative for neck pain and neck stiffness.   Eyes: Negative for pain.  Respiratory: Negative for shortness of breath.   Cardiovascular: Negative for chest pain.  Gastrointestinal: Negative for abdominal pain.  Genitourinary: Negative for dysuria.  Musculoskeletal: Negative for back pain.  Skin: Negative for rash.  Neurological: Negative for headaches.  All other systems reviewed and are negative.    Allergies  Shellfish allergy  Home Medications   Current Outpatient Rx  Name  Route  Sig  Dispense  Refill  . ARIPiprazole (ABILIFY) 15 MG tablet   Oral   Take 1 tablet (15 mg total) by mouth daily.   34 tablet   0     Enough for 34 days   . ARIPiprazole (ABILIFY) 15 MG tablet   Oral   Take 1 tablet (15 mg total) by mouth daily.   30 tablet   0    . clonazePAM (KLONOPIN) 0.5 MG tablet   Oral   Take 2 tablets (1 mg total) by mouth 3 (three) times daily as needed for anxiety.   12 tablet   0   . clonazePAM (KLONOPIN) 1 MG tablet   Oral   Take 1 tablet (1 mg total) by mouth 3 (three) times daily as needed for anxiety.   102 tablet   0     Enough for 34 days   . traZODone (DESYREL) 100 MG tablet   Oral   Take 1 tablet (100 mg total) by mouth at bedtime.   34 tablet   0     Enough for 34 days   . traZODone (DESYREL) 100 MG tablet   Oral   Take 1 tablet (100 mg total) by mouth at bedtime.   30 tablet   0     BP 106/65  Pulse 96  Temp(Src) 98 F (36.7 C) (Oral)  Resp 18  Physical Exam  Constitutional: He is oriented to person, place, and time. He appears well-developed and well-nourished.  HENT:  Head: Normocephalic and atraumatic.  Eyes: EOM are normal. Pupils are equal, round, and reactive to light.  Neck: Neck supple.  Cardiovascular: Normal rate, regular rhythm and intact distal pulses.   Pulmonary/Chest: Effort normal and breath sounds normal. No respiratory distress.  Abdominal: Soft.  Bowel sounds are normal. He exhibits no distension. There is no tenderness. There is no rebound.  Musculoskeletal: Normal range of motion. He exhibits no edema.  Neurological: He is alert and oriented to person, place, and time.  Skin: Skin is warm and dry.  Psychiatric: His behavior is normal. Judgment and thought content normal.  mildly depressed affect    ED Course  Procedures (including critical care time)  Labs Reviewed - No data to display Dg Chest 2 View  10/27/2012  *RADIOLOGY REPORT*  Clinical Data: Medical clearance  CHEST - 2 VIEW  Comparison: None.  Findings: Normal mediastinum and cardiac silhouette.  Normal pulmonary  vasculature.  No evidence of effusion, infiltrate, or pneumothorax.  No acute bony abnormality.  IMPRESSION: Normal chest radiograph.   Original Report Authenticated By: Genevive Bi, M.D.      1:51 AM ACT involved - outpatient shelters and Psych resources provided. No indication for IVC or admit at this time.   MDM  Polysubstance abuse and homeless.   No SI/ HI  Old records reviewed including PSY eval by Dr Jacky Kindle dated 10/27/12 - PT has new Rx for benzos and Abilify and pain medication.         Sunnie Nielsen, MD 10/29/12 [phone]

## 2012-10-29 NOTE — ED Notes (Addendum)
During conversation pt states he knows if he says he's suicidal he will have to be 'placed'. States he has no place to go since leaving the hotel he was living in. States he needs treatment for to get medications. States he has a hx of suicide attempt, needs his medications, and needs a treatment center but isn't currently suicidal.

## 2012-10-29 NOTE — ED Notes (Signed)
Security paged to wand pt. Sitter on way.

## 2012-10-29 NOTE — Progress Notes (Signed)
CARE MANAGEMENT ED NOTE 10/29/2012  Patient:  Matthew Hays, Matthew Hays   Account Number:  [id number]  Date Initiated:  10/29/2012  Documentation initiated by:  Fransico Michael  Subjective/Objective Assessment:   presented to ED with c/o substance abuse and homeless     Subjective/Objective Assessment Detail:     Action/Plan:   patient requesting assistance with placement for drug rehab   Action/Plan Detail:   Anticipated DC Date:       Status Recommendation to Physician:   Result of Recommendation:     In-house referral  Clinical Social Worker   DC Planning Services  CM consult    Choice offered to / List presented to:            Status of service:    ED Comments:   ED Comments Detail:  10/29/12-0952-J.Minnich,RN,BSN     Patient situation and need reported off to [place], CSW. Social work to follow. No further CM needs identified.  10/29/12-0901-J.Minnich,RN,BSN [phone]      Spoke with Ian Malkin, CSW AD regarding patient. NCM requested that we send patient to [facility] by train. Zach agreed. RNCM to report off to Leslie,CSW covering ED.  10/29/12-0843-J.Minnich,RN,BSN [phone]      In to speak with patient. Extremely lethargic. Arouses to verbal stimuli but requires alot of stimuli to question patient. Patient requesting help with medications, "I got only 34 days worth at [facility] and that is gone. I need more help." Also requesting help "finding a place to live". Confirms that he did tell EDP that he could live with his mother in [place]. RN, Melissa attempting to call mother.  10/29/12-0833-J.Minnich,RN,BSN [phone]      Received call this am at approx. 0720 from [facility] in ED with request to assist patient to return to [facility]. Upon talking with Dr. Denton Lank, EDP, "patient is from [place] area and has 'moved' up here and is homeless. Patient has presented to both [facility] ED and [facility] ED numerous times with request for placement. His mother lives in [place] and he says he can live with her, but he  can't get back to [place]".      CARE MANAGEMENT ED NOTE 10/29/2012  Patient:  Matthew Hays, Matthew Hays   Account Number:  [id number]  Date Initiated:  10/29/2012  Documentation initiated by:  Fransico Michael  Subjective/Objective Assessment:   presented to ED with c/o substance abuse and homeless     Subjective/Objective Assessment Detail:     Action/Plan:   patient requesting assistance with placement for drug rehab   Action/Plan Detail:   Anticipated DC Date:       Status Recommendation to Physician:   Result of Recommendation:     In-house referral  Clinical Social Worker   DC Planning Services  CM consult    Choice offered to / List presented to:            Status of service:    ED Comments:   ED Comments Detail:  10/29/12-0952-J.Minnich,RN,BSN     Patient situation and need reported off to [place], CSW. Social work to follow. No further CM needs identified.  10/29/12-0901-J.Minnich,RN,BSN [phone]      Spoke with Ian Malkin, CSW AD regarding patient. NCM requested that we send patient to [facility] by train. Zach agreed. RNCM to report off to Leslie,CSW covering ED.  10/29/12-0843-J.Minnich,RN,BSN [phone]      In to speak with patient. Extremely lethargic. Arouses to verbal stimuli but requires alot of stimuli to question patient. Patient requesting help with medications, "I got only 34 days worth at  [facility] and that is gone. I need more help." Also requesting help "finding a place to live". Confirms that he did tell EDP that he could live with his mother in [place]. RN, Melissa attempting to call mother.  10/29/12-0833-J.Minnich,RN,BSN [phone]      Received call this am at approx. 0720 from [facility] in ED with request to assist patient to return to [facility]. Upon talking with Dr. Denton Lank, EDP, "patient is from [place] area and has 'moved' up here and is homeless. Patient has presented to both [facility] ED and [facility] ED numerous times with request for placement. His mother lives in [place] and he  says he can live with her, but he can't get back to [place]".

## 2012-10-29 NOTE — ED Notes (Signed)
We are waiting for pt train ticket to be available and pt to wake up so he can go to the train station and go to [place] where his family is.

## 2012-10-29 NOTE — ED Notes (Signed)
Spoke with pj and she advises that Child psychotherapist is waiting for her director to get out of a meeting to get the credit card to purchase pt train ticket. He will be leaving on the 630 pm train to [employer]

## 2012-10-29 NOTE — ED Notes (Signed)
Patient to shower

## 2012-10-29 NOTE — ED Notes (Signed)
Pt seen here yesterday for SI. Pt reports that he has been off his medication for a couple days and beginning to feel hopeless and feeling like he wants to jump off a bridge. Pt is schizoaffective, hearing voices which he says he does not listen to but make him feel bleak. Pt has been tx in facility in past for sx. Denies HI. Yesterday he smoked marijuana and did cocaine. Pt seeking help for SI. Pt placed in paper scrubs. House coverage notified for sitter. Pt cooperative, a&ox4.

## 2012-10-29 NOTE — ED Notes (Signed)
Second call made to patients mother, Arline Asp [phone]. No answer, message left. Case Worker working on getting pt back to [place] where pt admits he has support and treatment. Pt agrees to staff working to get him back to [place].

## 2012-10-29 NOTE — ED Notes (Signed)
In to assess pt. He is sleeping. He did eat breakfast. Sitter at bedside

## 2012-10-29 NOTE — ED Notes (Signed)
1:1 sitter at bedside.

## 2012-10-29 NOTE — Clinical Social Work Note (Signed)
Clinical Child psychotherapist staffed case with [employer] and [employer], [employer]. CSW was able to provide train ticket to patient for transport to [place], [place]. CSW will sign off, as social work intervention is no longer needed.   Rozetta Nunnery, MSW, [place] (Covering ED) [phone]

## 2012-10-29 NOTE — ED Notes (Signed)
Pt calm and cooperative with good eye contact. MD at bedside for eval.

## 2012-10-29 NOTE — ED Notes (Signed)
Patient states he is aggrevated with his life. States he will be ok to travel to [employer] by train.

## 2012-10-29 NOTE — ED Notes (Signed)
Pt belongings removed from room- in paper scrubs.  Security in to wand pt.

## 2012-10-29 NOTE — ED Provider Notes (Signed)
History     CSN: [id number]  Arrival date & time 10/29/12  0632   First MD Initiated Contact with Patient 10/29/12 0703      Chief complaint: substance abuse, anxious, homeless  (Consider location/radiation/quality/duration/timing/severity/associated sxs/prior treatment) The history is provided by the patient.  pt w hx anxiety, ADD, schizoaffective disorder presents stating he is newly homeless, kicked out of local hotel, feeling very anxious, out of meds, needs to return home, intermittent feelings of depression.  Denies hallucinations. States uses thc, and cocaine, formerly opiate abuse. Denies overdose or thoughts/plan to hurt self. Pt states he has trouble getting his meds from the community mental health system in [place]. Denies any physical health symptoms or current problems. No nv. No shakiness. No diaphoresis. No seizures.      Past Medical History  Diagnosis Date  . Schizoaffective disorder   . ADD (attention deficit disorder)     Past Surgical History  Procedure Laterality Date  . Appendectomy      No family history on file.  History  Substance Use Topics  . Smoking status: Current Every Day Smoker  . Smokeless tobacco: Not on file  . Alcohol Use: No      Review of Systems  Constitutional: Negative for fever.  HENT: Negative for neck pain.   Eyes: Negative for pain.  Respiratory: Negative for cough and shortness of breath.   Cardiovascular: Negative for chest pain.  Gastrointestinal: Negative for vomiting, abdominal pain and diarrhea.  Genitourinary: Negative for flank pain.  Musculoskeletal: Negative for back pain.  Skin: Negative for rash.  Neurological: Negative for headaches.  Hematological: Does not bruise/bleed easily.  Psychiatric/Behavioral: The patient is nervous/anxious.     Allergies  Shellfish allergy  Home Medications   Current Outpatient Rx  Name  Route  Sig  Dispense  Refill  . acetaminophen (TYLENOL) 325 MG tablet   Oral  Take 650 mg by mouth every 6 (six) hours as needed for pain (for migraine.).         Marland Kitchen ARIPiprazole (ABILIFY) 15 MG tablet   Oral   Take 1 tablet (15 mg total) by mouth daily.   30 tablet   0   . traZODone (DESYREL) 100 MG tablet   Oral   Take 1 tablet (100 mg total) by mouth at bedtime.   30 tablet   0   . clonazePAM (KLONOPIN) 1 MG tablet   Oral   Take 1 tablet (1 mg total) by mouth 3 (three) times daily as needed for anxiety.   102 tablet   0     Enough for 34 days     BP 114/54  Pulse 64  Temp(Src) 97.6 F (36.4 C) (Oral)  Resp 16  Physical Exam  Nursing note and vitals reviewed. Constitutional: He is oriented to person, place, and time. He appears well-developed and well-nourished. No distress.  HENT:  Head: Atraumatic.  Nose: Nose normal.  Mouth/Throat: Oropharynx is clear and moist.  Eyes: Conjunctivae are normal. Pupils are equal, round, and reactive to light. No scleral icterus.  Neck: Neck supple. No tracheal deviation present.  Cardiovascular: Normal rate, normal heart sounds and intact distal pulses.   Pulmonary/Chest: Effort normal and breath sounds normal. No accessory muscle usage. No respiratory distress.  Abdominal: Soft. He exhibits no distension. There is no tenderness.  Musculoskeletal: Normal range of motion. He exhibits no edema and no tenderness.  Neurological: He is alert and oriented to person, place, and time.  Skin: Skin is warm  and dry.  Psychiatric:  Mildly anxious, otherwise normal mood/affect. Clear thoughts processes. No delusions or hallucinations.     ED Course  Procedures (including critical care time)     MDM  Reviewed nursing notes and prior charts for additional history.   Reviewed prior charts, multiple ED visits in past couple months. Discussed w pt how we can help him - he states he wants to return to [place], where he is from, mom still there, has relationship w her.   Several other references in prior ED visits RE  return to [place], although pt continues to bounce from shelter to hotel to homelessness, from mental health center to ED.   Pt states out of his klonopin for 3-4 days. States when on that medication he feels he is 'less dramatic', less anxious.   Pt agreeable w plan to return to home/[place], pt states he wants to return home, states his mom will pick him up from train station and will be able to assist him with his mental health and SA follow up there.  Pt w normal mood/affect on recheck. Optimistic about return to home. No SI.   SW has met w pt, they have arranged bus pass to train station and then ticket to get to home, his mom will pick him up there.   Pt stable for d/c.          Suzi Roots, MD 10/29/12 [phone]

## 2012-10-29 NOTE — ED Notes (Signed)
Pt has stated that he has don a gram of cocaine today

## 2012-10-29 NOTE — ED Notes (Signed)
Pt eating lunch

## 2012-10-29 NOTE — ED Notes (Signed)
Pt was seen at [facility] for similar complaints yesterday night.  Pt completed telepsych and was cleared to leave and given a place to follow up.  Pt was kicked out of hotel tonight and has no place to go.  Admits to doing cocaine tonight.  Denies any SI/HI.  Pt cooperative and acting appropriately.

## 2012-10-29 NOTE — ED Notes (Signed)
Pt states that he has been feeling hopeless and has thoughts of hurting himself, but does not have a plan.  He has not gotten his meds filled and that he brought a lot of cocaine today. Diagnosed with schizoaffective disorder. Family not very supportive.

## 2012-10-29 NOTE — ED Notes (Signed)
Pt upset that he is being discharged.  Pt became upset and came to desk demanding clothing.  Pt given belongings - refused to wait for discharge instructions or to take medication.  Pt left without paperwork.

## 2013-06-10 IMAGING — CR DG CHEST 2V
2 series · 2 of 2 positions shown · non-contrast
Comparison: None.

CLINICAL DATA: Medical clearance

CHEST - 2 VIEW

[w chest pa]
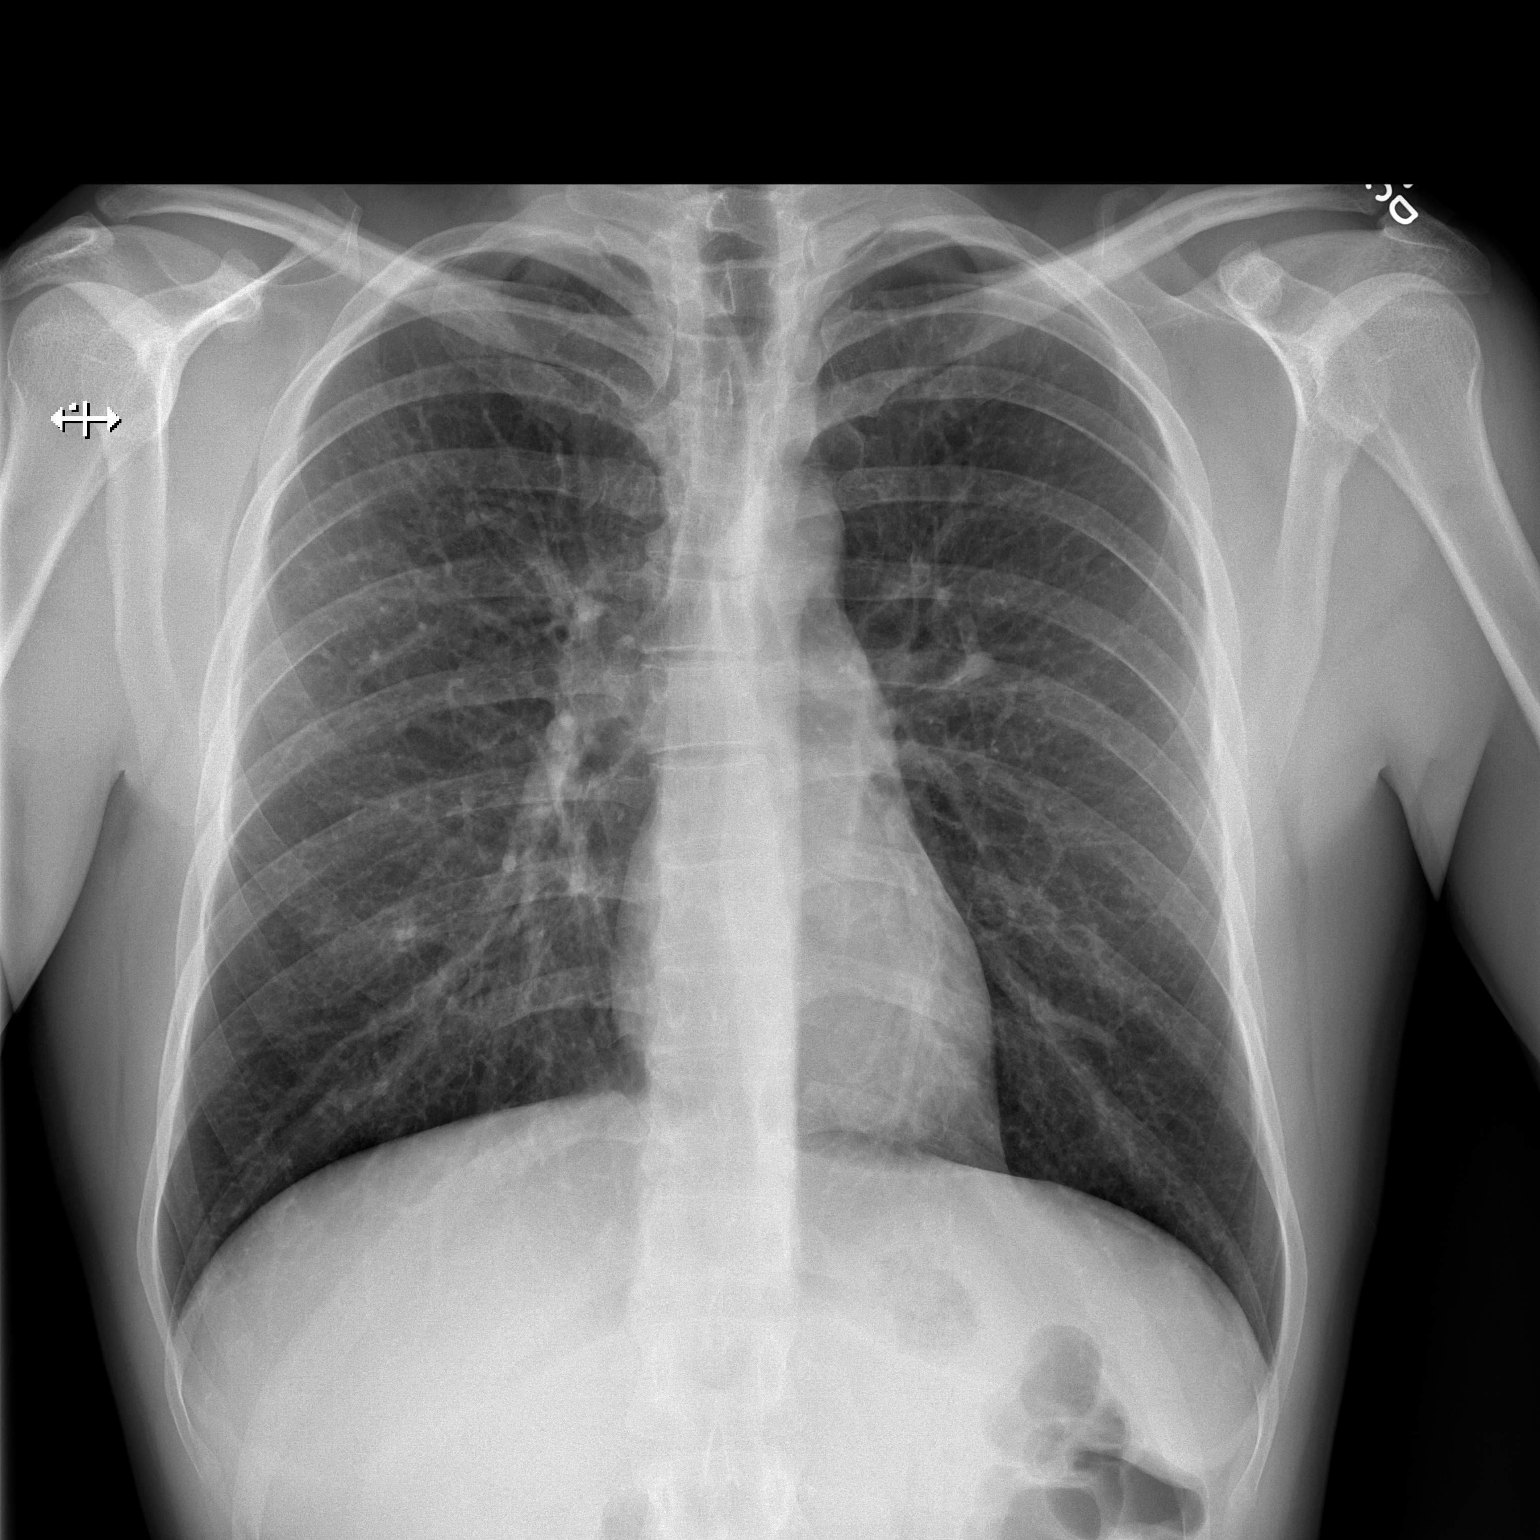

[w chest lat]
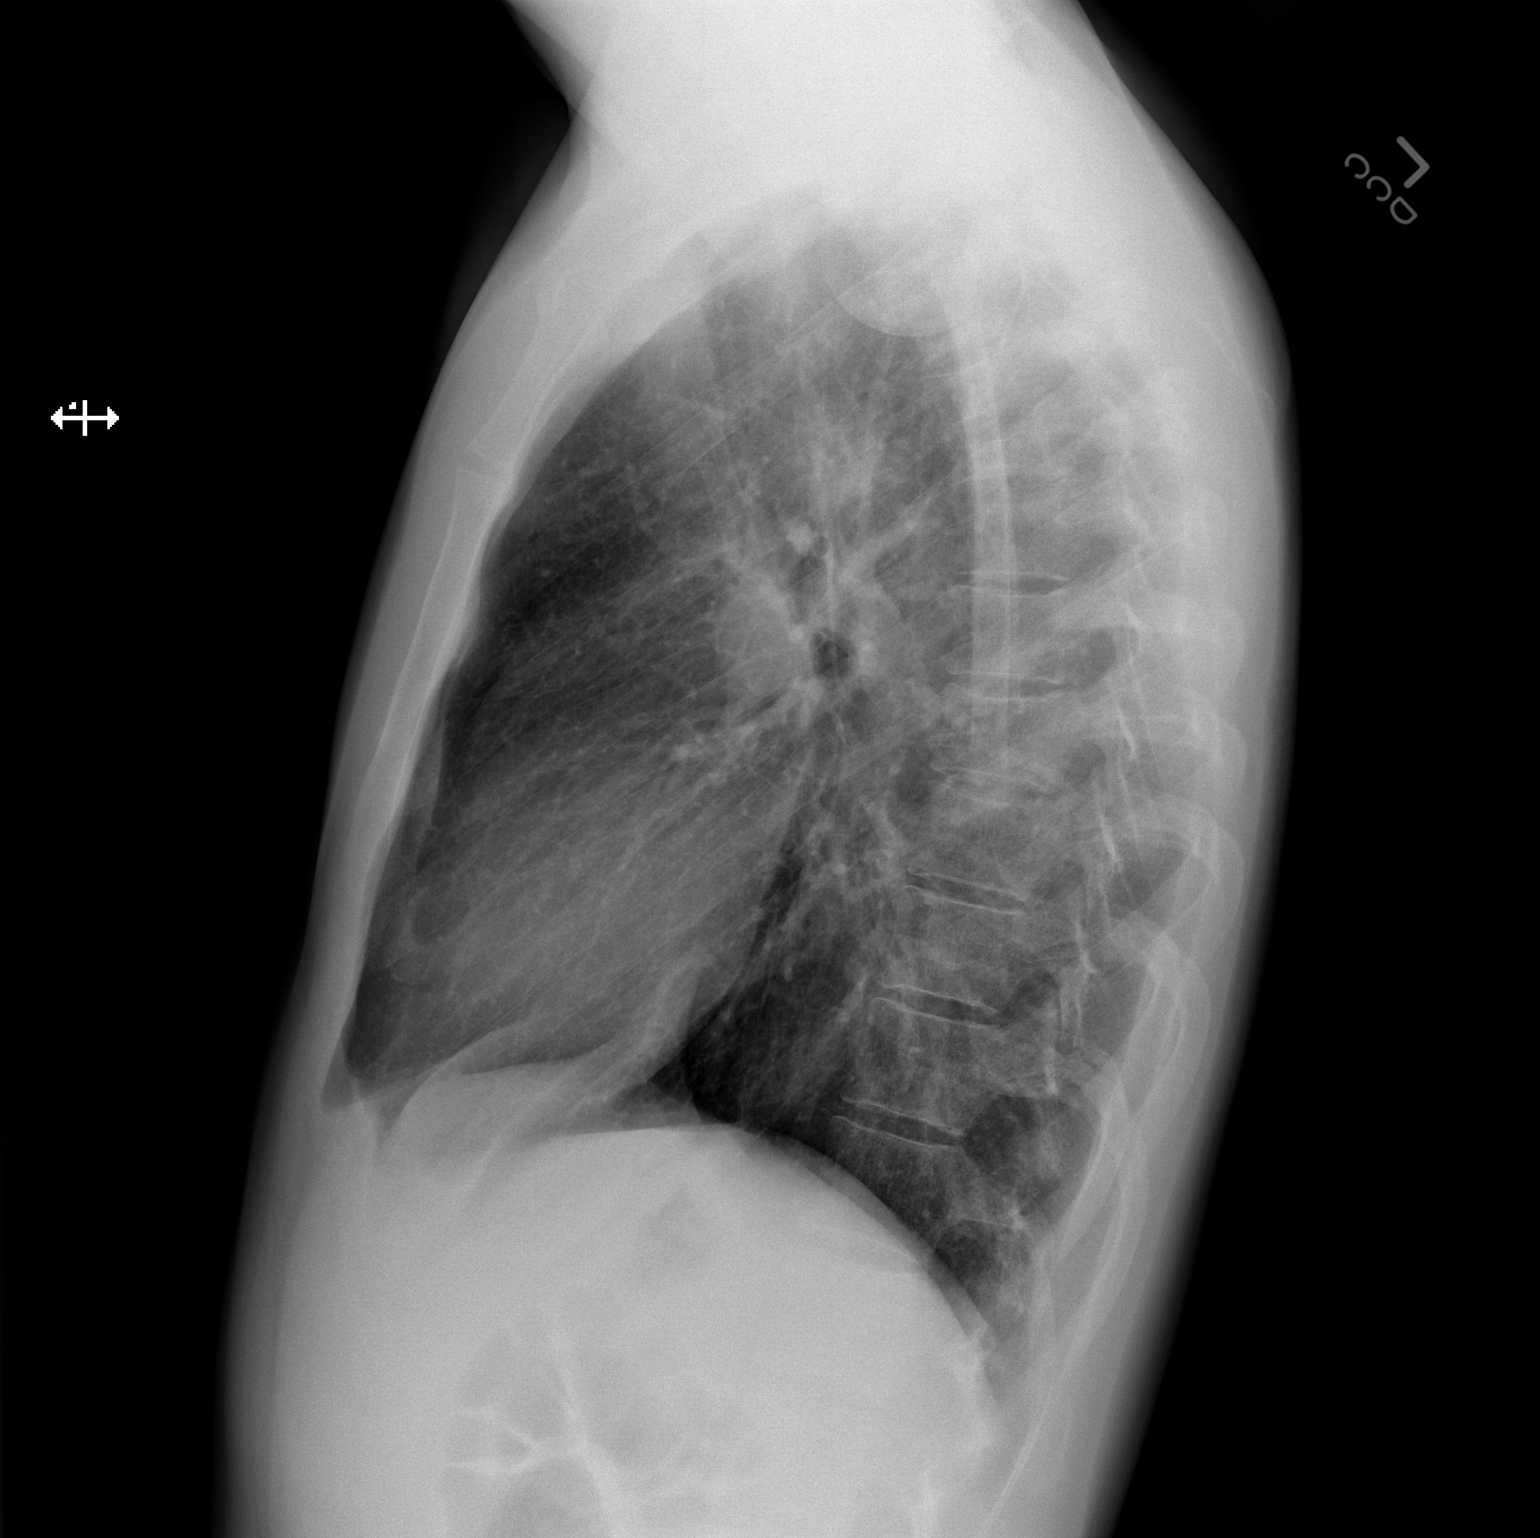

[2 of 2 positions shown; findings below may reference images not displayed]

FINDINGS: Normal mediastinum and cardiac silhouette.  Normal
pulmonary  vasculature.  No evidence of effusion, infiltrate, or
pneumothorax.  No acute bony abnormality.
IMPRESSION: Normal chest radiograph.

## 2014-07-04 ENCOUNTER — Inpatient Hospital Stay: Payer: Self-pay | Admitting: Psychiatry

## 2014-07-06 LAB — COMPREHENSIVE METABOLIC PANEL
ALBUMIN: 3.5 g/dL (ref 3.4–5.0)
ALT: 101 U/L — AB
ANION GAP: 6 — AB (ref 7–16)
Alkaline Phosphatase: 106 U/L
BILIRUBIN TOTAL: 0.2 mg/dL (ref 0.2–1.0)
BUN: 12 mg/dL (ref 7–18)
CALCIUM: 8.4 mg/dL — AB (ref 8.5–10.1)
CHLORIDE: 107 mmol/L (ref 98–107)
Co2: 27 mmol/L (ref 21–32)
Creatinine: 0.75 mg/dL (ref 0.60–1.30)
EGFR (Non-African Amer.): >60
Glucose: 105 mg/dL — ABNORMAL HIGH (ref 65–99)
Osmolality: 280 (ref 275–301)
POTASSIUM: 4.2 mmol/L (ref 3.5–5.1)
SGOT(AST): 49 U/L — ABNORMAL HIGH (ref 15–37)
Sodium: 140 mmol/L (ref 136–145)
Total Protein: 7.6 g/dL (ref 6.4–8.2)

## 2014-07-06 LAB — CBC
HCT: 47.3 % (ref 40.0–52.0)
HGB: 15.4 g/dL (ref 13.0–18.0)
MCH: 27.4 pg (ref 26.0–34.0)
MCHC: 32.5 g/dL (ref 32.0–36.0)
MCV: 84 fL (ref 80–100)
Platelet: 209 10*3/uL (ref 150–440)
RBC: 5.63 10*6/uL (ref 4.40–5.90)
RDW: 15.1 % — AB (ref 11.5–14.5)
WBC: 5.6 10*3/uL (ref 3.8–10.6)

## 2014-07-06 LAB — LIPID PANEL
CHOLESTEROL: 190 mg/dL (ref 0–200)
HDL: 67 mg/dL — AB (ref 40–60)
Ldl Cholesterol, Calc: 88 mg/dL (ref 0–100)
Triglycerides: 174 mg/dL (ref 0–200)
VLDL Cholesterol, Calc: 35 mg/dL (ref 5–40)

## 2014-07-06 LAB — TSH: Thyroid Stimulating Horm: 0.85 u[IU]/mL

## 2014-07-06 LAB — HEMOGLOBIN A1C: Hemoglobin A1C: 5.5 % (ref 4.2–6.3)
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
